# Patient Record
Sex: Male | Born: 1985 | Race: White | Hispanic: No | Marital: Married | Smoking: Never smoker
Health system: Southern US, Community
[De-identification: ages and names within clinical notes are randomized; demographics above are authoritative.]

## PROBLEM LIST (undated history)

## (undated) ENCOUNTER — Emergency Department (HOSPITAL_COMMUNITY): Payer: Self-pay | Source: Home / Self Care

## (undated) HISTORY — PX: NO PAST SURGERIES: SHX2092

---

## 2006-07-10 ENCOUNTER — Emergency Department: Payer: Self-pay | Admitting: Emergency Medicine

## 2015-09-22 ENCOUNTER — Emergency Department (HOSPITAL_COMMUNITY): Payer: BLUE CROSS/BLUE SHIELD

## 2015-09-22 ENCOUNTER — Observation Stay (HOSPITAL_COMMUNITY)
Admission: EM | Admit: 2015-09-22 | Discharge: 2015-09-23 | Disposition: A | Discharge status: Home / Self Care | Payer: BLUE CROSS/BLUE SHIELD | Attending: General Surgery | Admitting: General Surgery

## 2015-09-22 ENCOUNTER — Encounter (HOSPITAL_COMMUNITY): Payer: Self-pay

## 2015-09-22 DIAGNOSIS — S2243XA Multiple fractures of ribs, bilateral, initial encounter for closed fracture: Secondary | ICD-10-CM | POA: Diagnosis present

## 2015-09-22 DIAGNOSIS — S92001A Unspecified fracture of right calcaneus, initial encounter for closed fracture: Secondary | ICD-10-CM | POA: Diagnosis present

## 2015-09-22 DIAGNOSIS — S2220XA Unspecified fracture of sternum, initial encounter for closed fracture: Secondary | ICD-10-CM | POA: Diagnosis not present

## 2015-09-22 DIAGNOSIS — S51012A Laceration without foreign body of left elbow, initial encounter: Secondary | ICD-10-CM | POA: Diagnosis not present

## 2015-09-22 DIAGNOSIS — S0091XA Abrasion of unspecified part of head, initial encounter: Secondary | ICD-10-CM | POA: Insufficient documentation

## 2015-09-22 DIAGNOSIS — S27321A Contusion of lung, unilateral, initial encounter: Secondary | ICD-10-CM | POA: Diagnosis present

## 2015-09-22 DIAGNOSIS — R52 Pain, unspecified: Secondary | ICD-10-CM

## 2015-09-22 DIAGNOSIS — S22008A Other fracture of unspecified thoracic vertebra, initial encounter for closed fracture: Secondary | ICD-10-CM | POA: Diagnosis present

## 2015-09-22 LAB — I-STAT CHEM 8, ED
BUN: 20 mg/dL (ref 6–20)
CALCIUM ION: 1.13 mmol/L (ref 1.13–1.30)
CREATININE: 1.3 mg/dL — AB (ref 0.61–1.24)
Chloride: 103 mmol/L (ref 101–111)
GLUCOSE: 141 mg/dL — AB (ref 65–99)
HCT: 42 % (ref 39.0–52.0)
HEMOGLOBIN: 14.3 g/dL (ref 13.0–17.0)
POTASSIUM: 3 mmol/L — AB (ref 3.5–5.1)
Sodium: 141 mmol/L (ref 135–145)
TCO2: 25 mmol/L (ref 0–100)

## 2015-09-22 LAB — PROTIME-INR
INR: 1.09
Prothrombin Time: 14.1 seconds (ref 11.4–15.2)

## 2015-09-22 LAB — COMPREHENSIVE METABOLIC PANEL
ALT: 39 U/L (ref 17–63)
ANION GAP: 8 (ref 5–15)
AST: 29 U/L (ref 15–41)
Albumin: 4.4 g/dL (ref 3.5–5.0)
Alkaline Phosphatase: 38 U/L (ref 38–126)
BUN: 17 mg/dL (ref 6–20)
CALCIUM: 9.1 mg/dL (ref 8.9–10.3)
CHLORIDE: 107 mmol/L (ref 101–111)
CO2: 24 mmol/L (ref 22–32)
CREATININE: 1.36 mg/dL — AB (ref 0.61–1.24)
Glucose, Bld: 147 mg/dL — ABNORMAL HIGH (ref 65–99)
Potassium: 3 mmol/L — ABNORMAL LOW (ref 3.5–5.1)
SODIUM: 139 mmol/L (ref 135–145)
Total Bilirubin: 1 mg/dL (ref 0.3–1.2)
Total Protein: 6.8 g/dL (ref 6.5–8.1)

## 2015-09-22 LAB — SAMPLE TO BLOOD BANK

## 2015-09-22 LAB — CBC
HCT: 41.7 % (ref 39.0–52.0)
HEMOGLOBIN: 14.1 g/dL (ref 13.0–17.0)
MCH: 28.5 pg (ref 26.0–34.0)
MCHC: 33.8 g/dL (ref 30.0–36.0)
MCV: 84.4 fL (ref 78.0–100.0)
Platelets: 219 10*3/uL (ref 150–400)
RBC: 4.94 MIL/uL (ref 4.22–5.81)
RDW: 12.4 % (ref 11.5–15.5)
WBC: 10.1 10*3/uL (ref 4.0–10.5)

## 2015-09-22 LAB — CDS SEROLOGY

## 2015-09-22 LAB — I-STAT CG4 LACTIC ACID, ED: LACTIC ACID, VENOUS: 2.5 mmol/L — AB (ref 0.5–1.9)

## 2015-09-22 LAB — ETHANOL

## 2015-09-22 MED ORDER — KETOROLAC TROMETHAMINE 30 MG/ML IJ SOLN
30.0000 mg | Freq: Four times a day (QID) | INTRAMUSCULAR | Status: DC | PRN
  Administered 2015-09-22 – 2015-09-23 (×2): 30 mg via INTRAVENOUS
  Filled 2015-09-22 (×2): qty 1

## 2015-09-22 MED ORDER — ENOXAPARIN SODIUM 40 MG/0.4ML ~~LOC~~ SOLN
40.0000 mg | SUBCUTANEOUS | Status: DC
  Administered 2015-09-23: 40 mg via SUBCUTANEOUS
  Filled 2015-09-22: qty 0.4

## 2015-09-22 MED ORDER — HYDROMORPHONE HCL 1 MG/ML IJ SOLN
1.0000 mg | Freq: Once | INTRAMUSCULAR | Status: AC
  Administered 2015-09-22: 1 mg via INTRAVENOUS
  Filled 2015-09-22: qty 1

## 2015-09-22 MED ORDER — ENOXAPARIN SODIUM 30 MG/0.3ML ~~LOC~~ SOLN: 30.0000 mg | SUBCUTANEOUS | Status: DC

## 2015-09-22 MED ORDER — HYDROMORPHONE HCL 1 MG/ML IJ SOLN
1.0000 mg | INTRAMUSCULAR | Status: DC | PRN
  Administered 2015-09-22: 1 mg via INTRAVENOUS
  Filled 2015-09-22 (×2): qty 1

## 2015-09-22 MED ORDER — HYDROMORPHONE HCL 1 MG/ML IJ SOLN
1.0000 mg | INTRAMUSCULAR | Status: DC | PRN
  Administered 2015-09-22 – 2015-09-23 (×5): 2 mg via INTRAVENOUS
  Filled 2015-09-22 (×5): qty 2

## 2015-09-22 MED ORDER — HYDROCODONE-ACETAMINOPHEN 5-325 MG PO TABS
2.0000 | ORAL_TABLET | ORAL | Status: DC | PRN
  Administered 2015-09-22 – 2015-09-23 (×3): 2 via ORAL
  Filled 2015-09-22 (×3): qty 2

## 2015-09-22 MED ORDER — ONDANSETRON HCL 4 MG PO TABS
4.0000 mg | ORAL_TABLET | Freq: Four times a day (QID) | ORAL | Status: DC | PRN
Start: 2015-09-22 — End: 2015-09-23

## 2015-09-22 MED ORDER — TETANUS-DIPHTH-ACELL PERTUSSIS 5-2.5-18.5 LF-MCG/0.5 IM SUSP
0.5000 mL | Freq: Once | INTRAMUSCULAR | Status: AC
  Administered 2015-09-22: 0.5 mL via INTRAMUSCULAR
  Filled 2015-09-22: qty 0.5

## 2015-09-22 MED ORDER — ONDANSETRON HCL 4 MG/2ML IJ SOLN: 4.0000 mg | Freq: Four times a day (QID) | INTRAMUSCULAR | Status: DC | PRN

## 2015-09-22 MED ORDER — IOPAMIDOL (ISOVUE-300) INJECTION 61%
INTRAVENOUS | Status: AC
  Administered 2015-09-22: 100 mL
  Filled 2015-09-22: qty 100

## 2015-09-22 MED ORDER — FENTANYL CITRATE (PF) 100 MCG/2ML IJ SOLN
50.0000 ug | Freq: Once | INTRAMUSCULAR | Status: AC
  Administered 2015-09-22: 50 ug via INTRAVENOUS
  Filled 2015-09-22: qty 2

## 2015-09-22 MED ORDER — SODIUM CHLORIDE 0.9 % IV SOLN
INTRAVENOUS | Status: DC
  Administered 2015-09-22 – 2015-09-23 (×2): via INTRAVENOUS

## 2015-09-22 MED ORDER — SODIUM CHLORIDE 0.9 % IV BOLUS (SEPSIS)
1000.0000 mL | Freq: Once | INTRAVENOUS | Status: AC
  Administered 2015-09-22: 1000 mL via INTRAVENOUS

## 2015-09-22 NOTE — Progress Notes (Signed)
Orthopedic Tech Progress Note Patient Details:  Christian Gilbert October 13, 1985 [id number]  Patient ID: Christian Gilbert, male   DOB: October 13, 1985, 30 y.o.   MRN: [id number]   Nikki DomCrawford, Tonya Wantz 09/22/2015, 2:42 PM Made level 2 trauma visit

## 2015-09-22 NOTE — ED Notes (Signed)
Pt. Returned from CTscan and X-rays
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
## 2015-09-22 NOTE — Progress Notes (Signed)
Patient medicated with 2mg  Dilaudid at 1936 and 2 Vicodin at 2027. Foot/leg repositioned. Patient reporting pain 10 out of 10 at this time.  Dr. Derrell Lollingamirez notified.  Order for Toradol received.

## 2015-09-22 NOTE — ED Notes (Signed)
Pt. Transferred to Ct and X-ray
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
## 2015-09-22 NOTE — Progress Notes (Signed)
Orthopedic Tech Progress Note Patient Details:  Christian Gilbert 12/30/1985 [id number]  Ortho Devices Type of Ortho Device: Ace wrap, Post (short leg) splint Ortho Device/Splint Location: RLE Ortho Device/Splint Interventions: Ordered, Application   Jennye MoccasinHughes, Christian Gilbert 09/22/2015, 6:33 PM
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
## 2015-09-23 ENCOUNTER — Encounter (HOSPITAL_COMMUNITY): Payer: Self-pay | Admitting: General Practice

## 2015-09-23 DIAGNOSIS — S27321A Contusion of lung, unilateral, initial encounter: Secondary | ICD-10-CM | POA: Diagnosis present

## 2015-09-23 DIAGNOSIS — S22008A Other fracture of unspecified thoracic vertebra, initial encounter for closed fracture: Secondary | ICD-10-CM | POA: Diagnosis present

## 2015-09-23 DIAGNOSIS — S92001A Unspecified fracture of right calcaneus, initial encounter for closed fracture: Secondary | ICD-10-CM | POA: Diagnosis present

## 2015-09-23 DIAGNOSIS — S2243XA Multiple fractures of ribs, bilateral, initial encounter for closed fracture: Secondary | ICD-10-CM | POA: Diagnosis present

## 2015-09-23 LAB — URINALYSIS, ROUTINE W REFLEX MICROSCOPIC
BILIRUBIN URINE: NEGATIVE
Glucose, UA: 250 mg/dL — AB
Hgb urine dipstick: NEGATIVE
Ketones, ur: NEGATIVE mg/dL
Leukocytes, UA: NEGATIVE
NITRITE: NEGATIVE
PH: 6 (ref 5.0–8.0)
Protein, ur: NEGATIVE mg/dL
SPECIFIC GRAVITY, URINE: 1.042 — AB (ref 1.005–1.030)

## 2015-09-23 LAB — CBC
HEMATOCRIT: 39.6 % (ref 39.0–52.0)
Hemoglobin: 13.1 g/dL (ref 13.0–17.0)
MCH: 28.2 pg (ref 26.0–34.0)
MCHC: 33.1 g/dL (ref 30.0–36.0)
MCV: 85.2 fL (ref 78.0–100.0)
PLATELETS: 166 10*3/uL (ref 150–400)
RBC: 4.65 MIL/uL (ref 4.22–5.81)
RDW: 12.5 % (ref 11.5–15.5)
WBC: 11.9 10*3/uL — ABNORMAL HIGH (ref 4.0–10.5)

## 2015-09-23 LAB — BASIC METABOLIC PANEL
Anion gap: 8 (ref 5–15)
BUN: 11 mg/dL (ref 6–20)
CHLORIDE: 105 mmol/L (ref 101–111)
CO2: 22 mmol/L (ref 22–32)
Calcium: 8.3 mg/dL — ABNORMAL LOW (ref 8.9–10.3)
Creatinine, Ser: 1 mg/dL (ref 0.61–1.24)
GFR calc Af Amer: >60 mL/min (ref >60)
GLUCOSE: 108 mg/dL — AB (ref 65–99)
POTASSIUM: 4.1 mmol/L (ref 3.5–5.1)
Sodium: 135 mmol/L (ref 135–145)

## 2015-09-23 MED ORDER — OXYCODONE HCL 5 MG PO TABS
10.0000 mg | ORAL_TABLET | ORAL | Status: DC | PRN
  Administered 2015-09-23 (×2): 10 mg via ORAL
  Filled 2015-09-23 (×2): qty 2

## 2015-09-23 MED ORDER — MORPHINE SULFATE (PF) 2 MG/ML IV SOLN: 2.0000 mg | INTRAVENOUS | Status: DC | PRN

## 2015-09-23 MED ORDER — POLYETHYLENE GLYCOL 3350 17 G PO PACK
17.0000 g | PACK | Freq: Every day | ORAL | Status: DC
  Administered 2015-09-23: 17 g via ORAL
  Filled 2015-09-23: qty 1

## 2015-09-23 MED ORDER — OXYCODONE-ACETAMINOPHEN 5-325 MG PO TABS: 1.0000 | ORAL_TABLET | ORAL | 0 refills | Status: AC | PRN

## 2015-09-23 MED ORDER — DOCUSATE SODIUM 100 MG PO CAPS
100.0000 mg | ORAL_CAPSULE | Freq: Two times a day (BID) | ORAL | Status: DC
  Administered 2015-09-23: 100 mg via ORAL
  Filled 2015-09-23: qty 1
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
## 2015-09-23 NOTE — Discharge Instructions (Signed)
Keep right leg elevated as much as possible. Keep splint/dressing clean and dry Do not put any weight on foot.  No driving while taking oxycodone.
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
## 2015-09-23 NOTE — Progress Notes (Signed)
Pt discharged home with a walker. Discharge education provided with no concerns voiced. Condition stable at time of leaving unit
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
